# Patient Record
Sex: Male | Born: 1985 | Race: Black or African American | Hispanic: No | Marital: Single | Smoking: Never smoker
Health system: Southern US, Community
[De-identification: ages and names within clinical notes are randomized; demographics above are authoritative.]

---
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
## 2005-04-11 ENCOUNTER — Emergency Department (HOSPITAL_COMMUNITY): Admission: EM | Admit: 2005-04-11 | Discharge: 2005-04-12 | Payer: Self-pay | Admitting: Emergency Medicine

## 2006-03-05 ENCOUNTER — Emergency Department (HOSPITAL_COMMUNITY): Admission: EM | Admit: 2006-03-05 | Discharge: 2006-03-05 | Payer: Self-pay | Admitting: Emergency Medicine

## 2007-11-07 ENCOUNTER — Emergency Department (HOSPITAL_COMMUNITY): Admission: EM | Admit: 2007-11-07 | Discharge: 2007-11-07 | Payer: Self-pay | Admitting: Emergency Medicine

## 2007-11-19 ENCOUNTER — Emergency Department (HOSPITAL_COMMUNITY): Admission: EM | Admit: 2007-11-19 | Discharge: 2007-11-19 | Payer: Self-pay | Admitting: Emergency Medicine

## 2008-06-12 ENCOUNTER — Emergency Department (HOSPITAL_COMMUNITY): Admission: EM | Admit: 2008-06-12 | Discharge: 2008-06-12 | Payer: Self-pay | Admitting: Emergency Medicine

## 2008-12-21 ENCOUNTER — Emergency Department (HOSPITAL_COMMUNITY): Admission: EM | Admit: 2008-12-21 | Discharge: 2008-12-21 | Payer: Self-pay | Admitting: Emergency Medicine

## 2013-09-05 ENCOUNTER — Emergency Department (HOSPITAL_COMMUNITY)
Admission: EM | Admit: 2013-09-05 | Discharge: 2013-09-05 | Disposition: A | Discharge status: Home / Self Care | Payer: 59 | Source: Home / Self Care | Attending: Emergency Medicine | Admitting: Emergency Medicine

## 2013-09-05 ENCOUNTER — Encounter (HOSPITAL_COMMUNITY): Payer: Self-pay | Admitting: Emergency Medicine

## 2013-09-05 ENCOUNTER — Emergency Department (INDEPENDENT_AMBULATORY_CARE_PROVIDER_SITE_OTHER): Payer: 59

## 2013-09-05 DIAGNOSIS — S8010XA Contusion of unspecified lower leg, initial encounter: Secondary | ICD-10-CM

## 2013-09-05 DIAGNOSIS — S8012XA Contusion of left lower leg, initial encounter: Secondary | ICD-10-CM

## 2013-09-05 NOTE — ED Notes (Signed)
States he noted a knot on his proximal tibial area a couple of days ago. No known injury, used aleve for discomfort

## 2013-09-05 NOTE — ED Provider Notes (Signed)
CSN: [id number]     Arrival date & time 09/05/13  1540 History   First MD Initiated Contact with Patient 09/05/13 1720     Chief Complaint  Patient presents with  . Leg Pain   (Consider location/radiation/quality/duration/timing/severity/associated sxs/prior Treatment) HPI  Patient presents with left leg pain. It is located on the anterior lower leg distal to the knee. Approximately one month in duration it is worsening. He describes as a tender nodule. It is not associated with swelling, bruising, or trauma. He has tried Aleve for the pain, but does not this helps very much. He denies any other similar-like lesions in other locations. No history of surgery or injury to the left knee or leg previously.   History reviewed. No pertinent past medical history. History reviewed. No pertinent past surgical history. History reviewed. No pertinent family history. History  Substance Use Topics  . Smoking status: Never Smoker   . Smokeless tobacco: Not on file  . Alcohol Use: Yes    Review of Systems Negative for weakness of the leg, swelling of any, rashes, fever, chills Allergies  Review of patient's allergies indicates no known allergies.  Home Medications  No current outpatient prescriptions on file. BP 114/70  Pulse 69  Temp(Src) 97.9 F (36.6 C) (Oral)  Resp 18  SpO2 98% Physical Exam Gen. Well-appearing, young male, no distress Left leg: mild tenderness to palpation over proximal tibia, no swelling, small ecchymosis Left knee: no effusion, normal range of motion, no laxity Neurologic: 5 strength of left lower extremity, 2+ Achilles tendon reflex on left   ED Course  Procedures (including critical care time) Labs Review Labs Reviewed - No data to display Imaging Review Dg Tibia/fibula Left  09/05/2013   CLINICAL DATA:  Left leg pain for 1 month. Patient now reports a knot.  EXAM: LEFT TIBIA AND FIBULA - 2 VIEW  COMPARISON:  None.  FINDINGS: There is no evidence of fracture  or other focal bone lesions. Soft tissues are unremarkable.  IMPRESSION: Negative.   Electronically Signed   By: Amie Portlandavid  Ormond M.D.   On: 09/05/2013 18:26     MDM   1. Contusion of leg, left    Given instruction for expectant management.     Garnetta BuddyEdward V Arby Dahir, MD 09/05/13 [phone]

## 2013-09-05 NOTE — ED Provider Notes (Signed)
Medical screening examination/treatment/procedure(s) were performed by a resident physician and as supervising physician I was immediately available for consultation/collaboration.  Leslee Homeavid Tyliyah Mcmeekin, M.D.  Reuben Likesavid C Payton Moder, MD 09/05/13 2142

## 2013-09-05 NOTE — Discharge Instructions (Signed)
[place],   I think you have a deep bruise called a contusion. This should improve with time. Take the alleve as necessary. Thankfully, the X-ray was completely normal.   Return as needed.   Take Care,   Dr. Clinton SawyerWilliamson

## 2013-11-05 ENCOUNTER — Ambulatory Visit: Payer: Self-pay

## 2013-11-05 ENCOUNTER — Encounter: Payer: Self-pay | Admitting: Podiatry

## 2013-11-05 ENCOUNTER — Ambulatory Visit (INDEPENDENT_AMBULATORY_CARE_PROVIDER_SITE_OTHER): Payer: 59 | Admitting: Podiatry

## 2013-11-05 VITALS — BP 111/65 | HR 66 | Resp 16

## 2013-11-05 DIAGNOSIS — L600 Ingrowing nail: Secondary | ICD-10-CM

## 2013-11-05 NOTE — Progress Notes (Signed)
   Subjective:    Patient ID: Darryl Paul, male    DOB: 09-Apr-1986, 28 y.o.   MRN: [id number]  HPI Comments: i have an ingrown toenail on my big toe on my left foot. i have had it for 1 year. Its gotten worse. It hurts to the touch. i havent done anything for my toenail. i trim my toenails.     Review of Systems  Allergic/Immunologic: Positive for environmental allergies.  All other systems reviewed and are negative.      Objective:   Physical Exam        Assessment & Plan:

## 2013-11-05 NOTE — Progress Notes (Signed)
Subjective:     Patient ID: [address], male   DOB: 1986/01/21, 28 y.o.   MRN: [id number]  HPI patient presents stating I have an awful ingrown toenail that I need to have fixed that it's been going on for around one year my left foot   Review of Systems  All other systems reviewed and are negative.      Objective:   Physical Exam  Nursing note and vitals reviewed. Constitutional: He is oriented to person, place, and time.  Cardiovascular: Intact distal pulses.   Musculoskeletal: Normal range of motion.  Neurological: He is oriented to person, place, and time.  Skin: Skin is warm.   neurovascular status intact with muscle strength adequate and range of motion within normal limits. Digits are well-perfused and left foot shows a toenail incurvated in both the medial and lateral border with pain to both sides secondary to the deformed nail     Assessment:     Chronic ingrown toenail left hallux    Plan:     H&P and reviewed condition. Recommended correction and explained procedure going over risk. Patient wants surgery and today I infiltrated 60 mg Xylocaine Marcaine mixture remove the medial and lateral border exposed matrix and applied phenol 3 applications 30 seconds followed by alcohol lavaged and sterile dressing

## 2013-11-05 NOTE — Patient Instructions (Signed)

## 2015-05-23 IMAGING — CR DG TIBIA/FIBULA 2V*L*
4 series · 4 of 4 positions shown · non-contrast
Comparison: None.

CLINICAL DATA: Left leg pain for 1 month. Patient now reports a
knot.

EXAM:
LEFT TIBIA AND FIBULA - 2 VIEW

[view not recorded (1 of 4)]
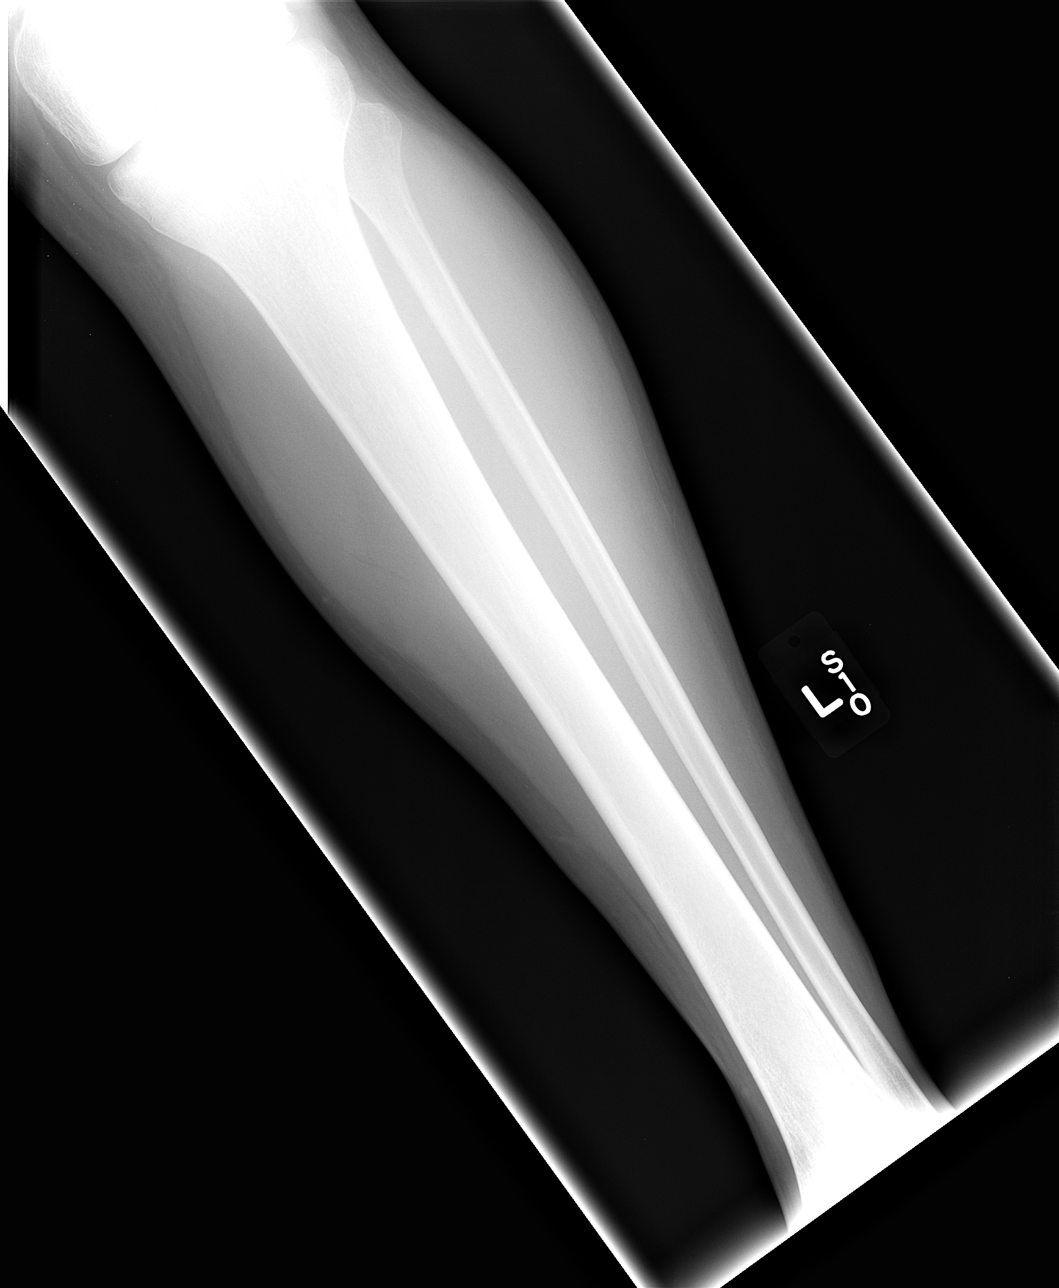

[view not recorded (2 of 4)]
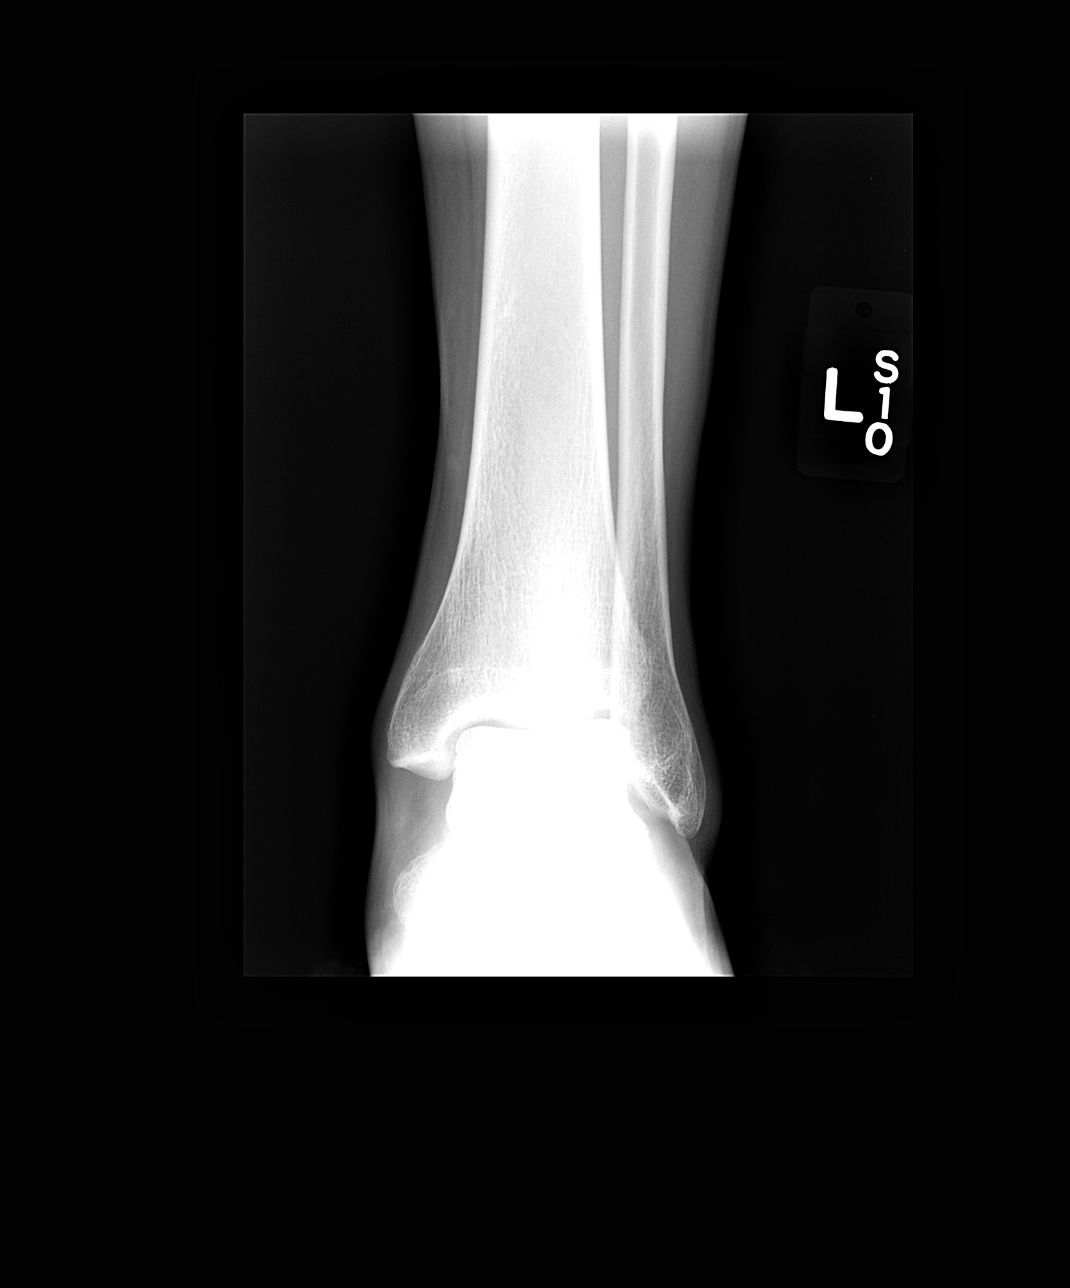

[view not recorded (3 of 4)]
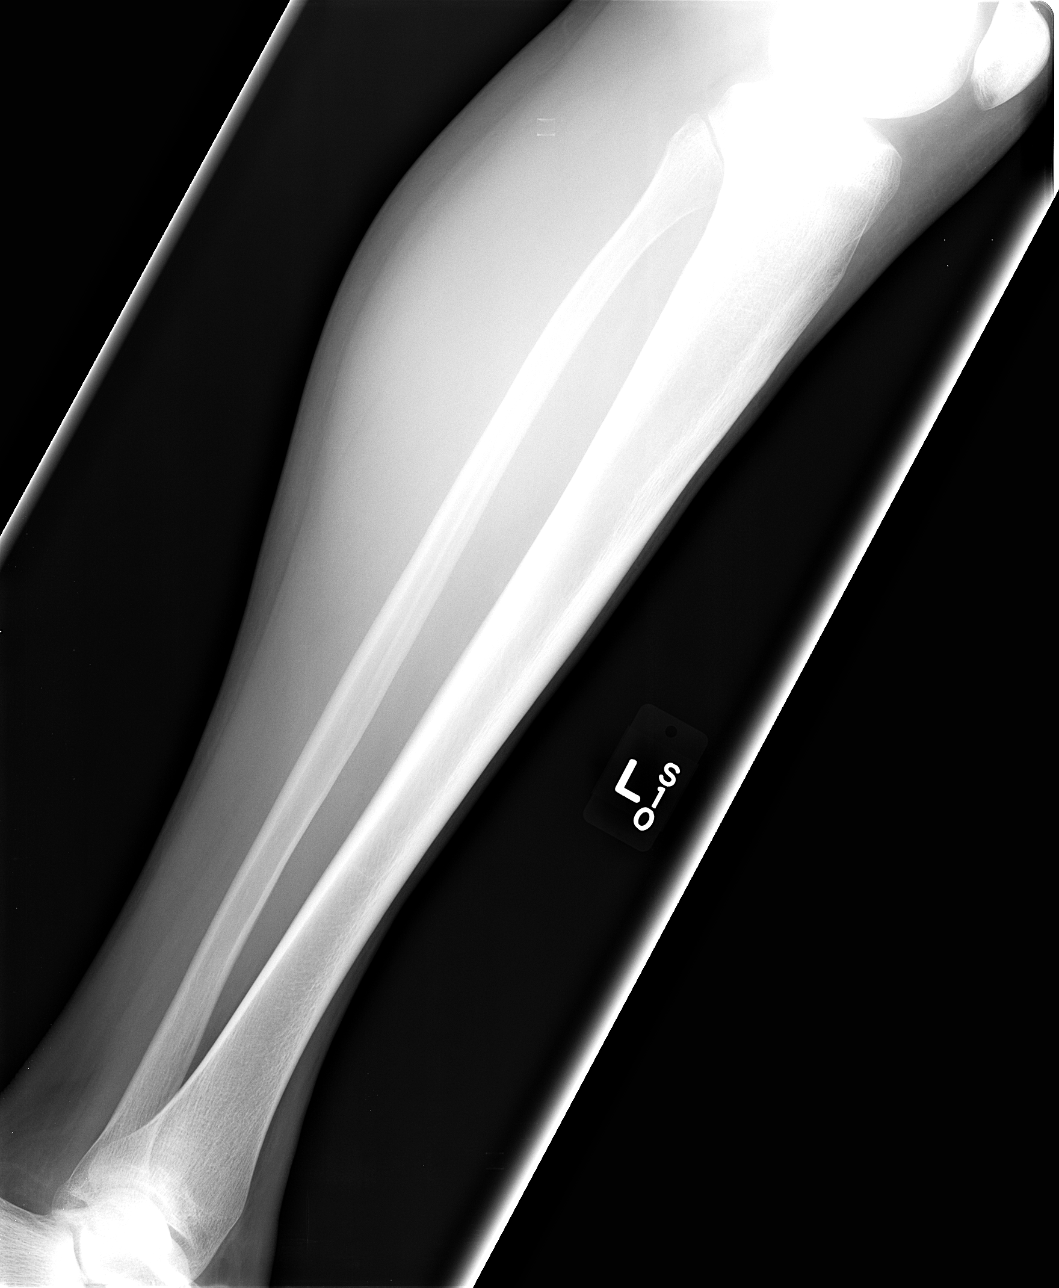

[view not recorded (4 of 4)]
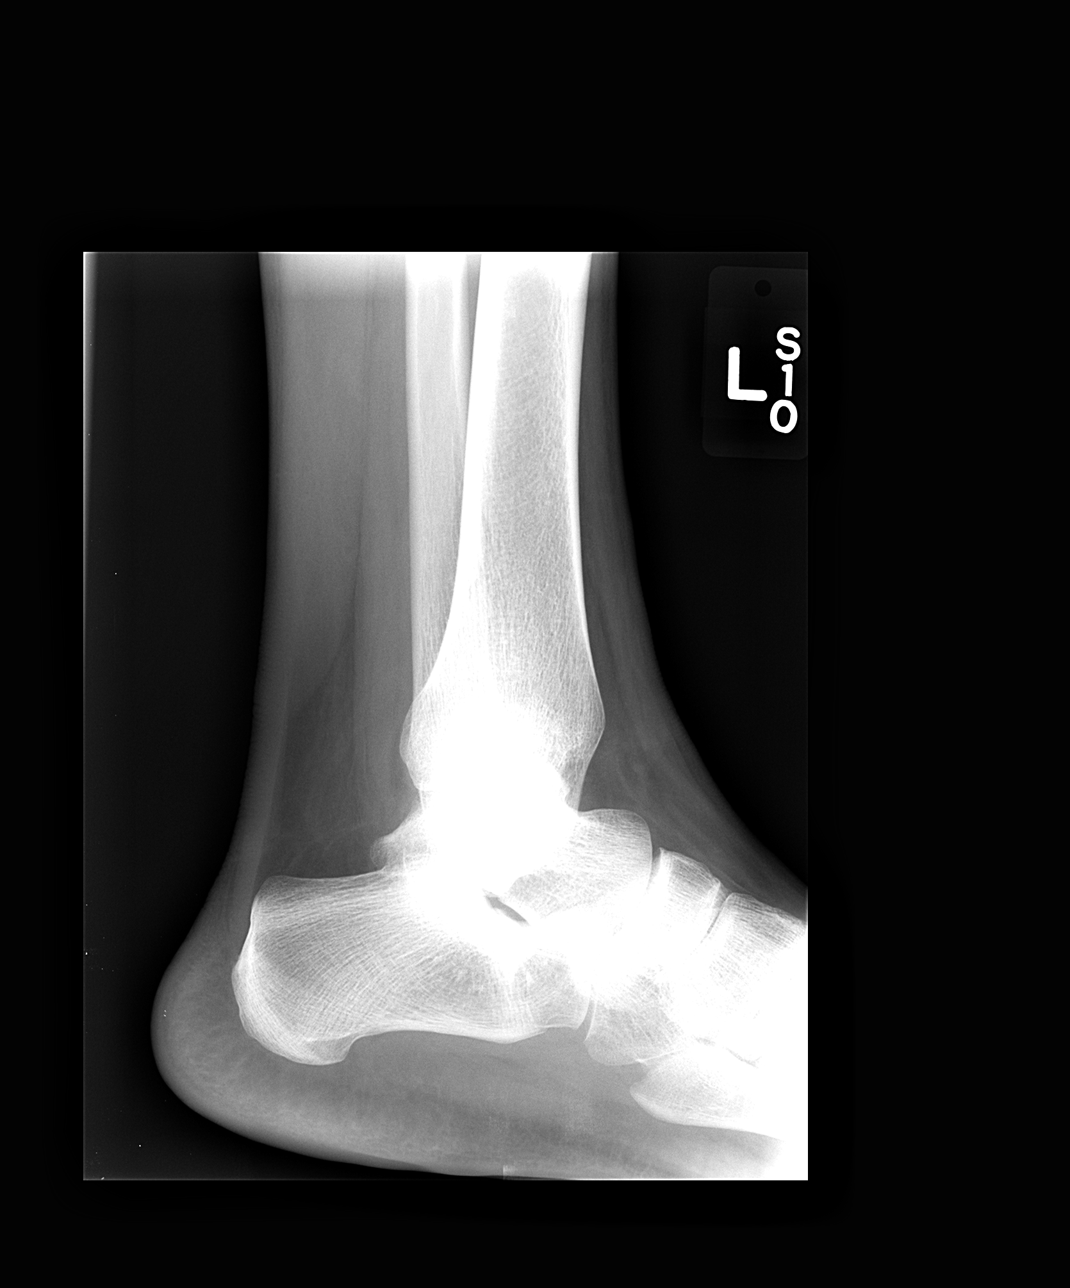

[4 of 4 positions shown; findings below may reference images not displayed]

FINDINGS: There is no evidence of fracture or other focal bone lesions. Soft
tissues are unremarkable.
IMPRESSION: Negative.

## 2017-06-05 ENCOUNTER — Emergency Department (HOSPITAL_COMMUNITY): Payer: 59

## 2017-06-05 ENCOUNTER — Emergency Department (HOSPITAL_COMMUNITY)
Admission: EM | Admit: 2017-06-05 | Discharge: 2017-06-05 | Disposition: A | Discharge status: Home / Self Care | Payer: 59 | Attending: Emergency Medicine | Admitting: Emergency Medicine

## 2017-06-05 ENCOUNTER — Other Ambulatory Visit: Payer: Self-pay

## 2017-06-05 ENCOUNTER — Encounter (HOSPITAL_COMMUNITY): Payer: Self-pay | Admitting: Emergency Medicine

## 2017-06-05 DIAGNOSIS — R0789 Other chest pain: Secondary | ICD-10-CM | POA: Diagnosis not present

## 2017-06-05 DIAGNOSIS — R079 Chest pain, unspecified: Secondary | ICD-10-CM

## 2017-06-05 LAB — BASIC METABOLIC PANEL
Anion gap: 5 (ref 5–15)
BUN: 15 mg/dL (ref 6–20)
CHLORIDE: 105 mmol/L (ref 101–111)
CO2: 27 mmol/L (ref 22–32)
CREATININE: 1.05 mg/dL (ref 0.61–1.24)
Calcium: 9.1 mg/dL (ref 8.9–10.3)
GFR calc Af Amer: >60 mL/min (ref >60)
GFR calc non Af Amer: >60 mL/min (ref >60)
GLUCOSE: 94 mg/dL (ref 65–99)
POTASSIUM: 3.7 mmol/L (ref 3.5–5.1)
Sodium: 137 mmol/L (ref 135–145)

## 2017-06-05 LAB — CBC
HEMATOCRIT: 38.5 % — AB (ref 39.0–52.0)
Hemoglobin: 12.6 g/dL — ABNORMAL LOW (ref 13.0–17.0)
MCH: 22.8 pg — ABNORMAL LOW (ref 26.0–34.0)
MCHC: 32.7 g/dL (ref 30.0–36.0)
MCV: 69.6 fL — AB (ref 78.0–100.0)
PLATELETS: 161 10*3/uL (ref 150–400)
RBC: 5.53 MIL/uL (ref 4.22–5.81)
RDW: 15.1 % (ref 11.5–15.5)
WBC: 6.3 10*3/uL (ref 4.0–10.5)

## 2017-06-05 LAB — I-STAT TROPONIN, ED: Troponin i, poc: 0 ng/mL (ref 0.00–0.08)

## 2017-06-05 NOTE — ED Notes (Signed)
Bed: WA24 Expected date:  Expected time:  Means of arrival:  Comments: Pt still in room 

## 2017-06-05 NOTE — ED Provider Notes (Signed)
[facility]-EMERGENCY DEPT Provider Note   CSN: [id number] Arrival date & time: 06/05/17  0048     History   Chief Complaint Chief Complaint  Patient presents with  . Chest Pain    HPI Darryl Paul is a 31 y.o. male.  31 year old male with no significant past medical history presents to the emergency department for evaluation of chest pain.  He states that he has had intermittent, sharp chest pain for the past month.  He denies any known modifying factors of symptoms.  He states that his pain is present to his lower left chest.  It is nonradiating and patient has not taken any medications to attempt pain relief.  He states that it will last a few seconds before spontaneously resolving.  He does no associated shortness of breath, but denies worsening pain with deep breathing.  He has not had any associated lightheadedness, syncope, nausea, vomiting, extremity numbness or paresthesias, leg swelling, recent surgeries, or recent hospitalizations.  No family history of sudden cardiac death or coronary artery disease.  He has no personal history of heart disease, dyslipidemia, hypertension, diabetes.  He is [place] where he has a primary care doctor.  He states that he just "wanted to get checked out".   The history is provided by the patient. No language interpreter was used.  Chest Pain      History reviewed. No pertinent past medical history.  There are no active problems to display for this patient.   History reviewed. No pertinent surgical history.     Home Medications    Prior to Admission medications   Medication Sig Start Date End Date Taking? Authorizing Provider  dextromethorphan (DELSYM) 30 MG/5ML liquid Take 60 mg by mouth every 12 (twelve) hours as needed for cough.   Yes [provider]    Family History History reviewed. No pertinent family history.  Social History Social History   Tobacco Use  . Smoking status: Never  Smoker  . Smokeless tobacco: Never Used  Substance Use Topics  . Alcohol use: Yes  . Drug use: No     Allergies   Patient has no known allergies.   Review of Systems Review of Systems  Cardiovascular: Positive for chest pain.  Ten systems reviewed and are negative for acute change, except as noted in the HPI.     Physical Exam Updated Vital Signs BP 111/72   Pulse (!) 53   Temp 98.3 F (36.8 C) (Oral)   Resp 13   Ht 5\' 11"  (1.803 m)   Wt 90.7 kg (200 lb)   SpO2 99%   BMI 27.89 kg/m   Physical Exam  Constitutional: He is oriented to person, place, and time. He appears well-developed and well-nourished. No distress.  Nontoxic appearing and in NAD  HENT:  Head: Normocephalic and atraumatic.  Eyes: Conjunctivae and EOM are normal. No scleral icterus.  Neck: Normal range of motion.  Cardiovascular: Normal rate, regular rhythm and intact distal pulses.  Pulmonary/Chest: Effort normal. No stridor. No respiratory distress. He has no wheezes. He has no rales.  Lungs CTAB. Respirations even and unlabored.  Abdominal: Soft. He exhibits no distension. There is no tenderness.  Musculoskeletal: Normal range of motion.  Neurological: He is alert and oriented to person, place, and time. He exhibits normal muscle tone. Coordination normal.  Skin: Skin is warm and dry. No rash noted. He is not diaphoretic. No erythema. No pallor.  Psychiatric: He has a normal mood and  affect. His behavior is normal.  Nursing note and vitals reviewed.    ED Treatments / Results  Labs (all labs ordered are listed, but only abnormal results are displayed) Labs Reviewed  CBC - Abnormal; Notable for the following components:      Result Value   Hemoglobin 12.6 (*)    HCT 38.5 (*)    MCV 69.6 (*)    MCH 22.8 (*)    All other components within normal limits  BASIC METABOLIC PANEL  I-STAT TROPONIN, ED    EKG  EKG Interpretation  Date/Time:  Tuesday June 05 2017 01:01:10 EST Ventricular  Rate:  62 PR Interval:    QRS Duration: 81 QT Interval:  363 QTC Calculation: 369 R Axis:   45 Text Interpretation:  Sinus rhythm Baseline wander in lead(s) II Otherwise within normal limits No old tracing to compare Confirmed by Dione BoozeGlick, David ([id number]) on 06/05/2017 1:13:01 AM       Radiology Dg Chest 2 View  Result Date: 06/05/2017 CLINICAL DATA:  31 year old male with chest pain. EXAM: CHEST  2 VIEW COMPARISON:  Chest radiograph dated 11/19/2007 FINDINGS: The heart size and mediastinal contours are within normal limits. Both lungs are clear. The visualized skeletal structures are unremarkable. IMPRESSION: No active cardiopulmonary disease. Electronically Signed   By: Elgie CollardArash  Radparvar M.D.   On: 06/05/2017 01:41    Procedures Procedures (including critical care time)  Medications Ordered in ED Medications - No data to display   Initial Impression / Assessment and Plan / ED Course  I have reviewed the triage vital signs and the nursing notes.  Pertinent labs & imaging results that were available during my care of the patient were reviewed by me and considered in my medical decision making (see chart for details).     31 year old male presents to the emergency department for evaluation of 1 month of intermittent, sharp chest pain.  Pain lasting a few seconds before spontaneously resolving and mildly associated with shortness of breath.  Patient denies any pleuritic nature to his pain.  It is not reproducible on palpation.  He denies taking any medications for his symptoms.  Cardiac workup in the emergency department today is reassuring.  EKG is nonischemic and troponin is negative.  In light of symptom chronicity and heart score of 0, I believe the patient is stable for follow-up with a primary care doctor.  I have discussed the possibility of an outpatient stress test.  Pulmonary embolus considered, but thought unlikely.  Patient is PERC negative.  He has no mediastinal widening on  chest x-ray to suggest dissection.  No pneumonia, pneumothorax, pleural effusion.  I have discussed the patient's reassuring workup with him and have answered all questions.  He has been advised to take Tylenol or ibuprofen for pain as needed.  Return precautions discussed and provided.  Patient discharged in stable condition with no unaddressed concerns.   Final Clinical Impressions(s) / ED Diagnoses   Final diagnoses:  Nonspecific chest pain    ED Discharge Orders    None       Antony MaduraHumes, Laval Cafaro, PA-C 06/05/17 0617    Dione BoozeGlick, David, MD 06/05/17 [phone]

## 2017-06-05 NOTE — ED Triage Notes (Signed)
Patient complaining of left chest pain. Patient states the chest pain has been going on for about a month. Patient states it is not a particular thing he is doing when the chest pain comes on. Patient states he wanted to get checked out.

## 2017-06-05 NOTE — Discharge Instructions (Signed)
We advise Tylenol or ibuprofen for persistent pain.  Your workup in the emergency department today did not reveal a concerning cause of your chest pain.  We recommend that you follow-up with your primary care doctor who may be able to coordinate a stress test or referral to a cardiologist if pain persists.  You may return to the emergency department, as needed, for new or concerning symptoms.

## 2019-02-20 IMAGING — CR DG CHEST 2V
2 series · 2 of 2 positions shown · non-contrast
Comparison: Chest radiograph dated 11/19/2007

CLINICAL DATA: 31-year-old male with chest pain.

EXAM:
CHEST  2 VIEW

[w chest pa]
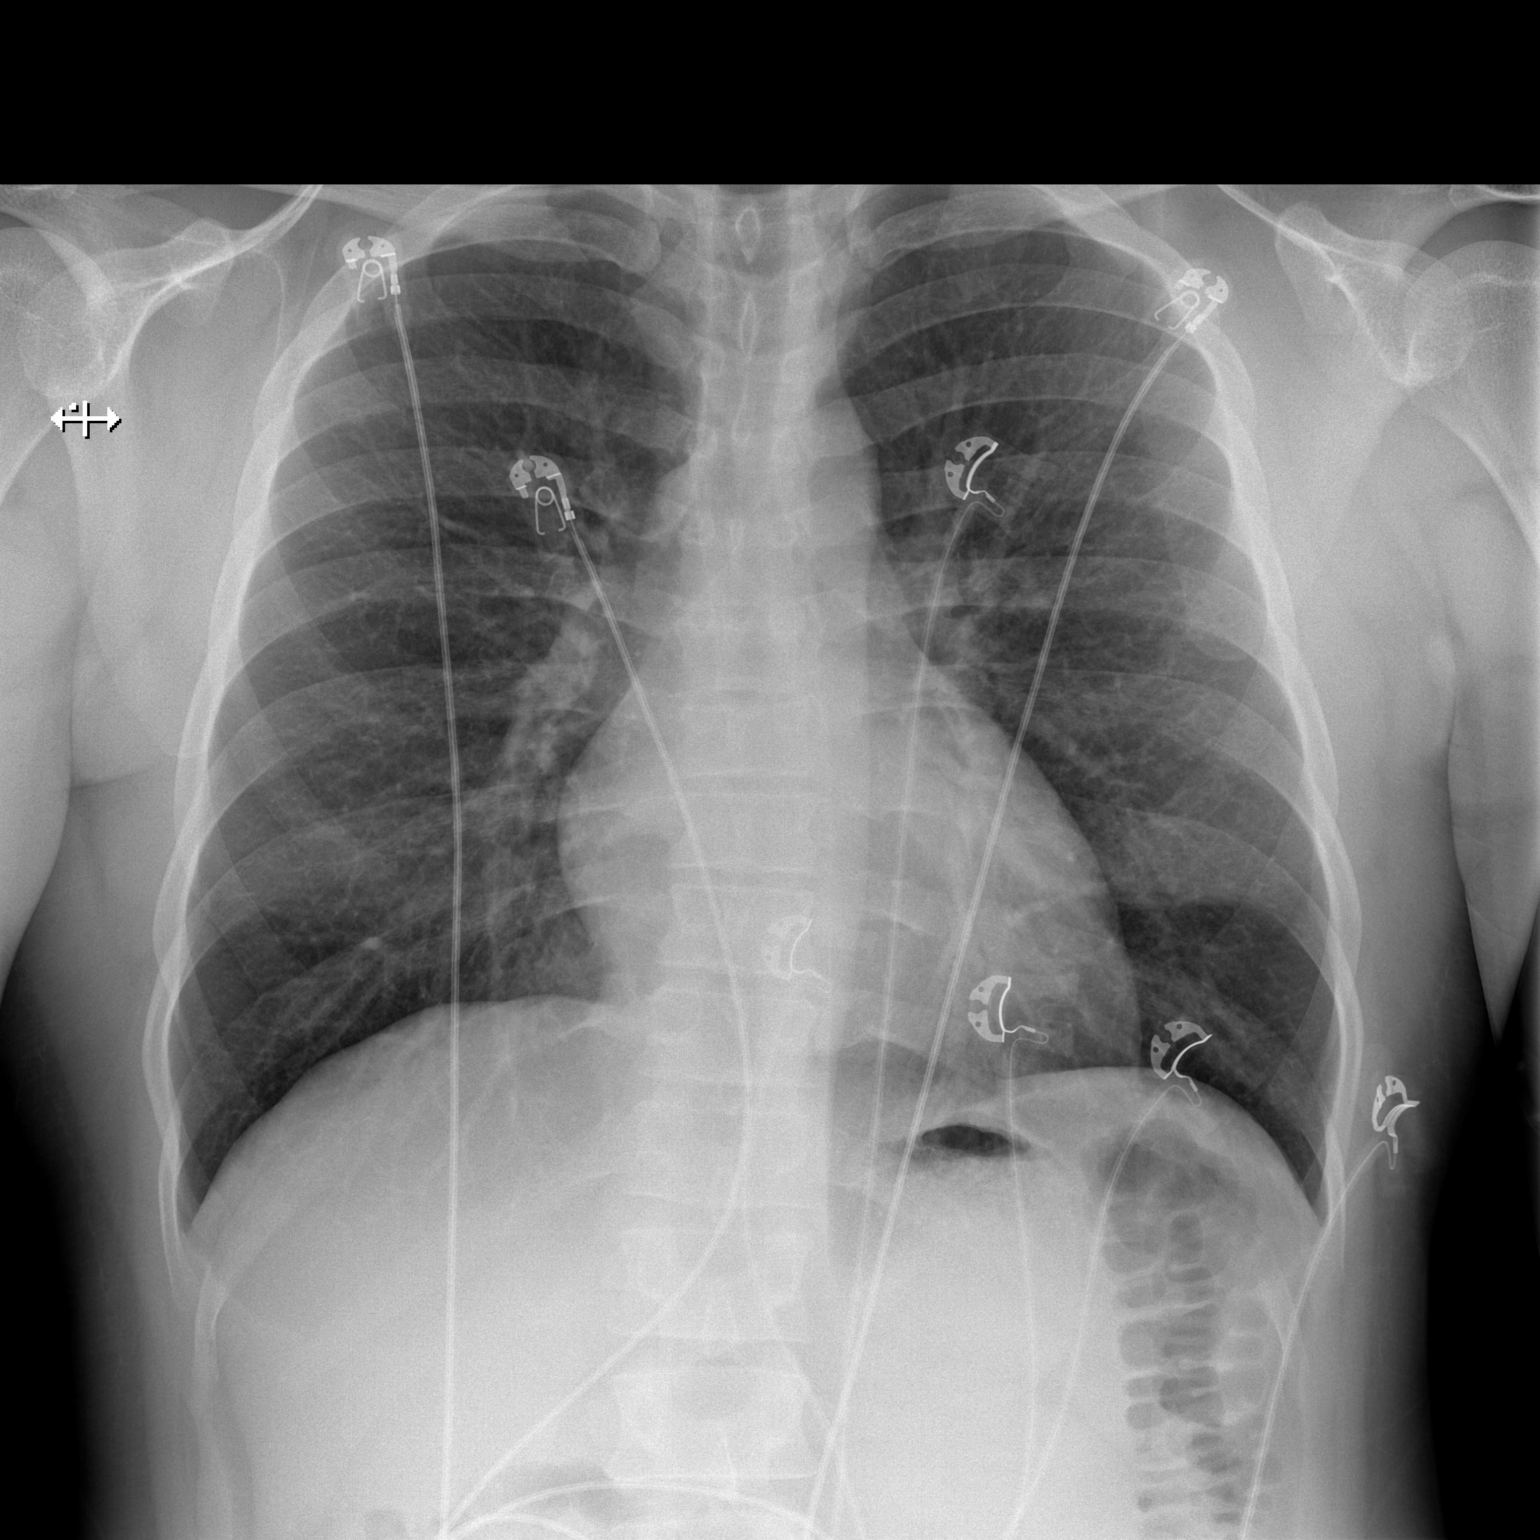

[w chest lat]
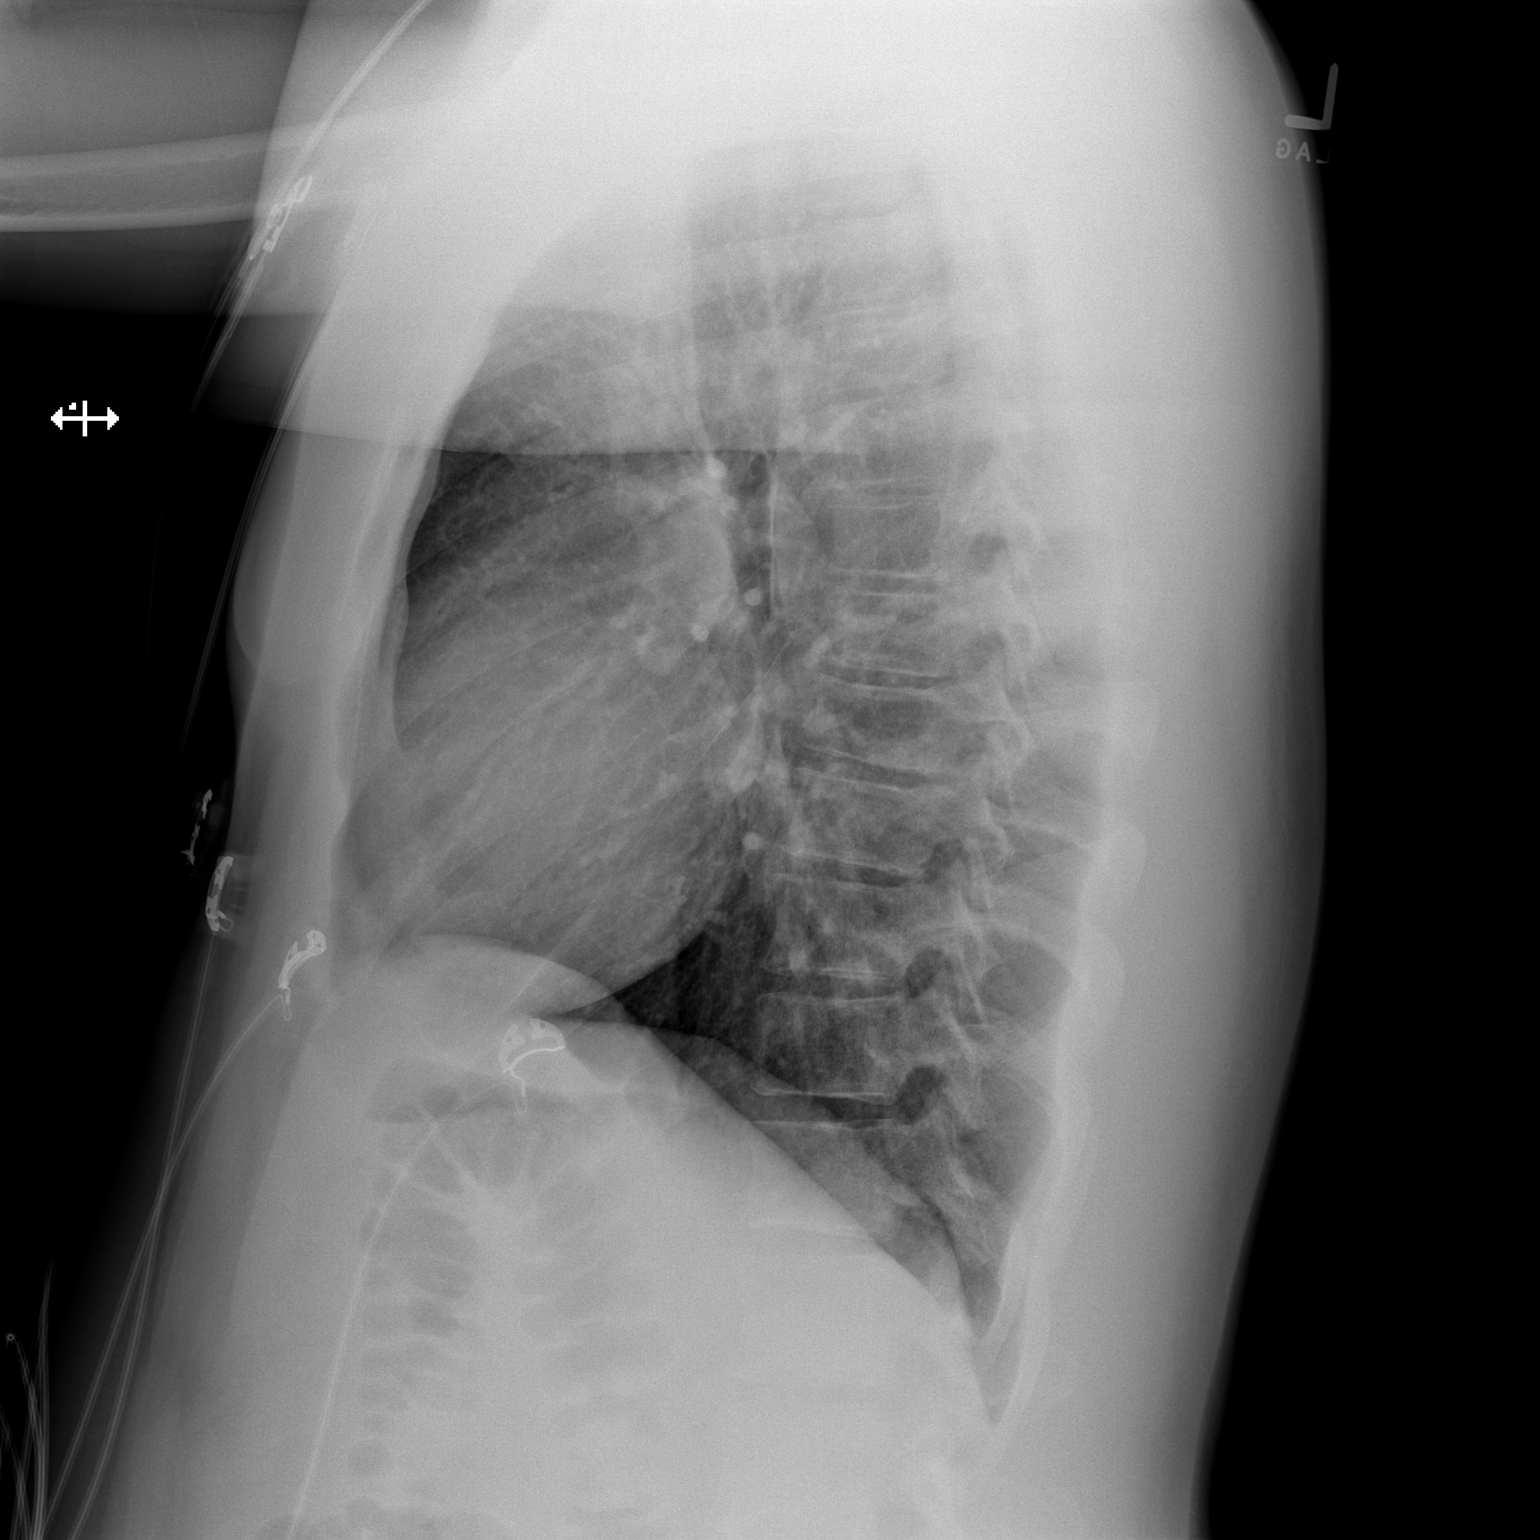

[2 of 2 positions shown; findings below may reference images not displayed]

FINDINGS: The heart size and mediastinal contours are within normal limits.
Both lungs are clear. The visualized skeletal structures are
unremarkable.
IMPRESSION: No active cardiopulmonary disease.
